# Patient Record
Sex: Female | Born: 2000 | Race: Black or African American | Hispanic: No | Marital: Single | Smoking: Current some day smoker
Health system: Southern US, Community
[De-identification: ages and names within clinical notes are randomized; demographics above are authoritative.]

## PROBLEM LIST (undated history)

## (undated) DIAGNOSIS — D649 Anemia, unspecified: Secondary | ICD-10-CM

## (undated) DIAGNOSIS — Z789 Other specified health status: Secondary | ICD-10-CM

## (undated) HISTORY — PX: NO PAST SURGERIES: SHX2092

---
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
## 2021-02-28 ENCOUNTER — Emergency Department (HOSPITAL_COMMUNITY)
Admission: EM | Admit: 2021-02-28 | Discharge: 2021-02-28 | Discharge status: Against Medical Advice | Payer: Medicaid Other

## 2021-02-28 ENCOUNTER — Encounter (HOSPITAL_COMMUNITY): Payer: Self-pay | Admitting: *Deleted

## 2021-02-28 NOTE — ED Triage Notes (Signed)
Pt has two tattoos, one to r hand and one to R leg, completed at home. Redness and warmth noted to R leg with drainage.   After completing triage, pt chose to leave due to wait

## 2021-06-12 ENCOUNTER — Other Ambulatory Visit: Payer: Self-pay

## 2021-06-12 ENCOUNTER — Emergency Department (HOSPITAL_COMMUNITY)
Admission: EM | Admit: 2021-06-12 | Discharge: 2021-06-12 | Disposition: A | Discharge status: Home / Self Care | Payer: Medicaid Other | Attending: Emergency Medicine | Admitting: Emergency Medicine

## 2021-06-12 ENCOUNTER — Encounter (HOSPITAL_COMMUNITY): Payer: Self-pay | Admitting: Emergency Medicine

## 2021-06-12 DIAGNOSIS — M545 Low back pain, unspecified: Secondary | ICD-10-CM | POA: Insufficient documentation

## 2021-06-12 DIAGNOSIS — N898 Other specified noninflammatory disorders of vagina: Secondary | ICD-10-CM | POA: Diagnosis present

## 2021-06-12 DIAGNOSIS — F17210 Nicotine dependence, cigarettes, uncomplicated: Secondary | ICD-10-CM | POA: Diagnosis not present

## 2021-06-12 LAB — URINALYSIS, ROUTINE W REFLEX MICROSCOPIC
Bilirubin Urine: NEGATIVE
Glucose, UA: NEGATIVE mg/dL
Hgb urine dipstick: NEGATIVE
Ketones, ur: NEGATIVE mg/dL
Leukocytes,Ua: NEGATIVE
Nitrite: NEGATIVE
Protein, ur: NEGATIVE mg/dL
Specific Gravity, Urine: 1.021 (ref 1.005–1.030)
pH: 6 (ref 5.0–8.0)

## 2021-06-12 MED ORDER — FLUCONAZOLE 150 MG PO TABS
150.0000 mg | ORAL_TABLET | Freq: Once | ORAL | Status: AC
  Administered 2021-06-12: 150 mg via ORAL
  Filled 2021-06-12: qty 1

## 2021-06-12 NOTE — Discharge Instructions (Addendum)
Please continue to monitor symptoms closely.  If you develop any new or worsening symptoms please come back to the emergency department.  It was a pleasure to meet you.

## 2021-06-12 NOTE — ED Provider Notes (Signed)
Emergency Medicine Provider Triage Evaluation Note  Deanna Reyes , a 20 y.o. female  was evaluated in triage.  Pt complains of being seen by health department two days ago and diagnosed with UTI (patient unsure about official diagnosis). She was placed on bactrim DS BID. Patient reports waking up this morning with some nausea, drowsiness. No rash, no throat swelling, no heart racing. Patient endorses some burning when she washes her vagina that is new. No dysuria, hematuria.  Review of Systems  Positive: As above Negative: As above  Physical Exam  BP 111/67 (BP Location: Right Arm)   Pulse 84   Temp 98 F (36.7 C) (Oral)   Resp 18   SpO2 100%  Gen:   Awake, no distress   Resp:  Normal effort  MSK:   Moves extremities without difficulty  Other:  No TTP suprapubic, no CVA tenderness  Medical Decision Making  Medically screening exam initiated at 6:07 PM.  Appropriate orders placed.  Deanna Reyes was informed that the remainder of the evaluation will be completed by another provider, this initial triage assessment does not replace that evaluation, and the importance of remaining in the ED until their evaluation is complete.  UTI(?), possible bactrim reaction, no signs of anaphylaxis   Olene Floss, PA-C 06/12/21 1809    Rolan Bucco, MD 06/12/21 2012

## 2021-06-12 NOTE — ED Provider Notes (Signed)
[facility] EMERGENCY DEPARTMENT Provider Note   CSN: [id number] Arrival date & time: 06/12/21  1748     History No chief complaint on file.   Deanna Reyes is a 20 y.o. female.  HPI Patient is a 20 year old female with a medical history as noted below.  She states that a few days ago she began developing some lower abdominal cramping as well as low back pain.  No dysuria or hematuria.  She was concerned that she could have a UTI so she went to the health department.  She states that she had a pelvic exam performed and showed me her lab results.  She was negative for gonorrhea, chlamydia, as well as trichomonas.  Wet prep was negative.  UA concerning for infection and she was discharged on Bactrim.  She has taken 4 doses of Bactrim.  She states that she has been experiencing nausea, vomiting, as well as vaginal irritation since starting Bactrim.  She states that the irritation only occurs when she is washing her self.  Denies any continued abdominal pain or back pain.  No other urinary complaints.    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/02/2019   Hx of chlamydia infection 04/02/2019    Past Surgical History:  Procedure Laterality Date   NO PAST SURGERIES       OB History     Gravida  1   Para  1   Term  1   Preterm      AB      Living  1      SAB      IAB      Ectopic      Multiple  0   Live Births  1           Family History  Problem Relation Age of Onset   Diabetes type I Mother    Lung cancer Father        smoker     Social History   Tobacco Use   Smoking status: Some Days    Types: Cigarettes   Smokeless tobacco: Current  Vaping Use   Vaping Use: Every day  Substance Use Topics   Alcohol use: No   Drug use: No    Home Medications Prior to Admission  medications   Medication Sig Start Date End Date Taking? Authorizing Provider  ibuprofen (ADVIL) 800 MG tablet Take 1 tablet (800 mg total) by mouth every 8 (eight) hours as needed. 07/23/20   Constant, Peggy, MD  metroNIDAZOLE (FLAGYL) 500 MG tablet Take two tablets by mouth twice a day, for one day.  Or you can take all four tablets at once if you can tolerate it. 04/16/20   Adam Phenix, MD  metroNIDAZOLE (FLAGYL) 500 MG tablet Take two tablets by mouth twice a day, for one day.  Or you can take all four tablets at once if you can tolerate it. 08/05/20   Adam Phenix, MD  Prenatal Vit-Fe Phos-FA-Omega (VITAFOL GUMMIES) 3.33-0.333-34.8 MG CHEW Chew 3 tablets by mouth daily. Patient not taking: Reported on 04/09/2020 12/10/18   Hurshel Party, CNM    Allergies    Patient has no known allergies.  Review of Systems   Review of Systems  All other systems reviewed and are negative. Ten systems  reviewed and are negative for acute change, except as noted in the HPI.   Physical Exam Updated Vital Signs BP 111/67 (BP Location: Right Arm)   Pulse 84   Temp 98 F (36.7 C) (Oral)   Resp 18   SpO2 100%   Physical Exam Vitals and nursing note reviewed.  Constitutional:      General: She is not in acute distress.    Appearance: Normal appearance. She is not ill-appearing, toxic-appearing or diaphoretic.  HENT:     Head: Normocephalic and atraumatic.     Right Ear: External ear normal.     Left Ear: External ear normal.     Nose: Nose normal.     Mouth/Throat:     Mouth: Mucous membranes are moist.     Pharynx: Oropharynx is clear. No oropharyngeal exudate or posterior oropharyngeal erythema.  Eyes:     Extraocular Movements: Extraocular movements intact.  Cardiovascular:     Rate and Rhythm: Normal rate and regular rhythm.     Pulses: Normal pulses.     Heart sounds: Normal heart sounds. No murmur heard.   No friction rub. No gallop.  Pulmonary:     Effort: Pulmonary effort  is normal. No respiratory distress.     Breath sounds: Normal breath sounds. No stridor. No wheezing, rhonchi or rales.  Abdominal:     General: Abdomen is flat.     Palpations: Abdomen is soft.     Tenderness: There is no abdominal tenderness.     Comments: Abdomen is flat, soft, and nontender.  No CVA tenderness.  Musculoskeletal:        General: Normal range of motion.     Cervical back: Normal range of motion and neck supple. No tenderness.  Skin:    General: Skin is warm and dry.  Neurological:     General: No focal deficit present.     Mental Status: She is alert and oriented to person, place, and time.  Psychiatric:        Mood and Affect: Mood normal.        Behavior: Behavior normal.   ED Results / Procedures / Treatments   Labs (all labs ordered are listed, but only abnormal results are displayed) Labs Reviewed  URINALYSIS, ROUTINE W REFLEX MICROSCOPIC - Abnormal; Notable for the following components:      Result Value   APPearance HAZY (*)    All other components within normal limits    EKG None  Radiology No results found.  Procedures Procedures   Medications Ordered in ED Medications  fluconazole (DIFLUCAN) tablet 150 mg (has no administration in time range)    ED Course  I have reviewed the triage vital signs and the nursing notes.  Pertinent labs & imaging results that were available during my care of the patient were reviewed by me and considered in my medical decision making (see chart for details).    MDM Rules/Calculators/A&P                          Pt is a 20 y.o. female who presents to the emergency department due to nausea, vomiting, as well as vaginal irritation that began after starting Bactrim yesterday.  Labs: UA is negative.  I, Placido SouLogan Nasia Cannan, PA-C, personally reviewed and evaluated these images and lab results as part of my medical decision-making.  Patient brought her lab work from the health department 2 days ago.  She had a  pelvic  exam performed and was negative for trichomoniasis, gonorrhea, as well as chlamydia.  Wet prep was negative.  UA did appear infectious at that time so she was started on Bactrim but UA today is clear.  Patient does note some vaginal irritation that she describes as a burning sensation when washing her self.  She states that this along with nausea/vomiting developed after starting Bactrim yesterday.  Possible developing yeast infection.  We will treat with a dose of Diflucan in the ED.  Feel the patient is stable for discharge at this time and she is agreeable.  We discussed return precautions.  Her questions were answered and she was amicable at the time of discharge.  Note: Portions of this report may have been transcribed using voice recognition software. Every effort was made to ensure accuracy; however, inadvertent computerized transcription errors may be present.   Final Clinical Impression(s) / ED Diagnoses Final diagnoses:  Vaginal irritation    Rx / DC Orders ED Discharge Orders     None        Rayna Sexton, PA-C 06/12/21 2037    Gareth Morgan, MD 06/14/21 2153

## 2021-06-12 NOTE — ED Triage Notes (Signed)
Pt states she went to the health dept on Thursday and is taking an antibiotic for a UTI.  She is unsure which antibiotic.  Reports feeling lightheaded, nausea, vomiting, and diarrhea that started today.    Denies pain.

## 2022-07-11 ENCOUNTER — Emergency Department (HOSPITAL_COMMUNITY): Payer: Medicaid Other

## 2022-07-11 ENCOUNTER — Emergency Department (HOSPITAL_COMMUNITY)
Admission: EM | Admit: 2022-07-11 | Discharge: 2022-07-12 | Discharge status: Against Medical Advice | Payer: Medicaid Other | Attending: Emergency Medicine | Admitting: Emergency Medicine

## 2022-07-11 ENCOUNTER — Encounter (HOSPITAL_COMMUNITY): Payer: Self-pay

## 2022-07-11 DIAGNOSIS — Z1152 Encounter for screening for COVID-19: Secondary | ICD-10-CM | POA: Diagnosis not present

## 2022-07-11 DIAGNOSIS — R059 Cough, unspecified: Secondary | ICD-10-CM | POA: Diagnosis present

## 2022-07-11 DIAGNOSIS — R519 Headache, unspecified: Secondary | ICD-10-CM | POA: Diagnosis not present

## 2022-07-11 DIAGNOSIS — R0602 Shortness of breath: Secondary | ICD-10-CM | POA: Insufficient documentation

## 2022-07-11 DIAGNOSIS — Z5321 Procedure and treatment not carried out due to patient leaving prior to being seen by health care provider: Secondary | ICD-10-CM | POA: Insufficient documentation

## 2022-07-11 DIAGNOSIS — R0981 Nasal congestion: Secondary | ICD-10-CM | POA: Insufficient documentation

## 2022-07-11 LAB — RESP PANEL BY RT-PCR (FLU A&B, COVID) ARPGX2
Influenza A by PCR: NEGATIVE
Influenza B by PCR: NEGATIVE
SARS Coronavirus 2 by RT PCR: NEGATIVE

## 2022-07-11 NOTE — ED Triage Notes (Signed)
Pt to ED c/o cold symptoms x 2 weeks, cough, runny nose, congestion. reports known sick contacts, daughter being treated for pneumonia.

## 2022-07-11 NOTE — ED Provider Triage Note (Signed)
Emergency Medicine Provider Triage Evaluation Note  Deanna Reyes , a 21 y.o. female  was evaluated in triage.  Pt complains of upper respiratory infection symptoms.  States that she has had cough and congestion for about 2 weeks.  She has had intermittent headache and generally just feels ill.  She denies sore throat, chest pain.  Intermittently short of breath but denies any fevers.  She states that her daughter is also sick..  Review of Systems  Positive: See above Negative:   Physical Exam  BP 109/70 (BP Location: Left Arm)   Pulse 63   Temp 98.2 F (36.8 C) (Oral)   Resp 16   Ht 5\' 4"  (1.626 m)   Wt 53.1 kg   SpO2 100%   BMI 20.08 kg/m  Gen:   Awake, no distress   Resp:  Normal effort, lungs are clear bilaterally, oxygenating at 100% on room air MSK:   Moves extremities without difficulty  Other:    Medical Decision Making  Medically screening exam initiated at 8:59 PM.  Appropriate orders placed.  Deanna Reyes was informed that the remainder of the evaluation will be completed by another provider, this initial triage assessment does not replace that evaluation, and the importance of remaining in the ED until their evaluation is complete.     Lorretta Harp, PA-C 07/11/22 2102

## 2022-07-12 NOTE — ED Notes (Signed)
Pt called x5 for vitals recheck, no response. Moving pt OTF.

## 2022-08-30 ENCOUNTER — Telehealth: Payer: Medicaid Other | Admitting: Physician Assistant

## 2022-08-30 DIAGNOSIS — B9689 Other specified bacterial agents as the cause of diseases classified elsewhere: Secondary | ICD-10-CM

## 2022-08-30 DIAGNOSIS — N760 Acute vaginitis: Secondary | ICD-10-CM | POA: Diagnosis not present

## 2022-08-30 MED ORDER — METRONIDAZOLE 500 MG PO TABS: 500.0000 mg | ORAL_TABLET | Freq: Two times a day (BID) | ORAL | 0 refills | Status: DC

## 2022-08-30 NOTE — Progress Notes (Signed)

## 2022-08-30 NOTE — Progress Notes (Signed)
I have spent 5 minutes in review of e-visit questionnaire, review and updating patient chart, medical decision making and response to patient.   Leray Garverick Cody Rejoice Heatwole, PA-C    

## 2022-09-13 ENCOUNTER — Ambulatory Visit: Payer: Medicaid Other

## 2022-09-17 ENCOUNTER — Telehealth: Payer: Medicaid Other | Admitting: Nurse Practitioner

## 2022-09-17 DIAGNOSIS — J029 Acute pharyngitis, unspecified: Secondary | ICD-10-CM

## 2022-09-17 MED ORDER — AMOXICILLIN 500 MG PO CAPS
500.0000 mg | ORAL_CAPSULE | Freq: Two times a day (BID) | ORAL | 0 refills | Status: AC
Start: 2022-09-17 — End: 2022-09-27

## 2022-09-17 NOTE — Progress Notes (Signed)

## 2022-09-18 ENCOUNTER — Telehealth: Payer: Medicaid Other | Admitting: Nurse Practitioner

## 2022-09-18 NOTE — Progress Notes (Signed)
Antibiotics sent yesterday

## 2022-10-02 ENCOUNTER — Telehealth: Payer: Medicaid Other | Admitting: Physician Assistant

## 2022-10-02 DIAGNOSIS — N760 Acute vaginitis: Secondary | ICD-10-CM

## 2022-10-02 DIAGNOSIS — B9689 Other specified bacterial agents as the cause of diseases classified elsewhere: Secondary | ICD-10-CM | POA: Diagnosis not present

## 2022-10-02 MED ORDER — METRONIDAZOLE 500 MG PO TABS: 500.0000 mg | ORAL_TABLET | Freq: Two times a day (BID) | ORAL | 0 refills | Status: AC

## 2022-10-02 NOTE — Progress Notes (Signed)
E-Visit for Vaginal Symptoms  We are sorry that you are not feeling well. Here is how we plan to help! Based on what you shared with me it looks like you: May have a vaginosis due to bacteria  Vaginosis is an inflammation of the vagina that can result in discharge, itching and pain. The cause is usually a change in the normal balance of vaginal bacteria or an infection. Vaginosis can also result from reduced estrogen levels after menopause.  The most common causes of vaginosis are:   Bacterial vaginosis which results from an overgrowth of one on several organisms that are normally present in your vagina.   Yeast infections which are caused by a naturally occurring fungus called candida.   Vaginal atrophy (atrophic vaginosis) which results from the thinning of the vagina from reduced estrogen levels after menopause.   Trichomoniasis which is caused by a parasite and is commonly transmitted by sexual intercourse.  Factors that increase your risk of developing vaginosis include: Medications, such as antibiotics and steroids Uncontrolled diabetes Use of hygiene products such as bubble bath, vaginal spray or vaginal deodorant Douching Wearing damp or tight-fitting clothing Using an intrauterine device (IUD) for birth control Hormonal changes, such as those associated with pregnancy, birth control pills or menopause Sexual activity Having a sexually transmitted infection  Your treatment plan is Metronidazole or Flagyl 500mg twice a day for 7 days.  I have electronically sent this prescription into the pharmacy that you have chosen.  Be sure to take all of the medication as directed. Stop taking any medication if you develop a rash, tongue swelling or shortness of breath. Mothers who are breast feeding should consider pumping and discarding their breast milk while on these antibiotics. However, there is no consensus that infant exposure at these doses would be harmful.  Remember that  medication creams can weaken latex condoms. .   HOME CARE:  Good hygiene may prevent some types of vaginosis from recurring and may relieve some symptoms:  Avoid baths, hot tubs and whirlpool spas. Rinse soap from your outer genital area after a shower, and dry the area well to prevent irritation. Don't use scented or harsh soaps, such as those with deodorant or antibacterial action. Avoid irritants. These include scented tampons and pads. Wipe from front to back after using the toilet. Doing so avoids spreading fecal bacteria to your vagina.  Other things that may help prevent vaginosis include:  Don't douche. Your vagina doesn't require cleansing other than normal bathing. Repetitive douching disrupts the normal organisms that reside in the vagina and can actually increase your risk of vaginal infection. Douching won't clear up a vaginal infection. Use a latex condom. Both female and female latex condoms may help you avoid infections spread by sexual contact. Wear cotton underwear. Also wear pantyhose with a cotton crotch. If you feel comfortable without it, skip wearing underwear to bed. Yeast thrives in moist environments Your symptoms should improve in the next day or two.  GET HELP RIGHT AWAY IF:  You have pain in your lower abdomen ( pelvic area or over your ovaries) You develop nausea or vomiting You develop a fever Your discharge changes or worsens You have persistent pain with intercourse You develop shortness of breath, a rapid pulse, or you faint.  These symptoms could be signs of problems or infections that need to be evaluated by a medical provider now.  MAKE SURE YOU   Understand these instructions. Will watch your condition. Will get help right   away if you are not doing well or get worse.  Thank you for choosing an e-visit.  Your e-visit answers were reviewed by a board certified advanced clinical practitioner to complete your personal care plan. Depending upon the  condition, your plan could have included both over the counter or prescription medications.  Please review your pharmacy choice. Make sure the pharmacy is open so you can pick up prescription now. If there is a problem, you may contact your provider through MyChart messaging and have the prescription routed to another pharmacy.  Your safety is important to us. If you have drug allergies check your prescription carefully.   For the next 24 hours you can use MyChart to ask questions about today's visit, request a non-urgent call back, or ask for a work or school excuse. You will get an email in the next two days asking about your experience. I hope that your e-visit has been valuable and will speed your recovery.   I have spent 5 minutes in review of e-visit questionnaire, review and updating patient chart, medical decision making and response to patient.   Cornelio Parkerson Z Ward, PA-C    

## 2024-01-01 ENCOUNTER — Encounter (HOSPITAL_COMMUNITY): Payer: Self-pay

## 2024-01-01 ENCOUNTER — Emergency Department (HOSPITAL_COMMUNITY): Payer: Worker's Compensation

## 2024-01-01 ENCOUNTER — Emergency Department (HOSPITAL_COMMUNITY)
Admission: EM | Admit: 2024-01-01 | Discharge: 2024-01-01 | Disposition: A | Discharge status: Home / Self Care | Payer: Worker's Compensation | Attending: Emergency Medicine | Admitting: Emergency Medicine

## 2024-01-01 ENCOUNTER — Other Ambulatory Visit: Payer: Self-pay

## 2024-01-01 DIAGNOSIS — Y990 Civilian activity done for income or pay: Secondary | ICD-10-CM | POA: Insufficient documentation

## 2024-01-01 DIAGNOSIS — Z23 Encounter for immunization: Secondary | ICD-10-CM | POA: Diagnosis not present

## 2024-01-01 DIAGNOSIS — S62633B Displaced fracture of distal phalanx of left middle finger, initial encounter for open fracture: Secondary | ICD-10-CM | POA: Insufficient documentation

## 2024-01-01 DIAGNOSIS — W208XXA Other cause of strike by thrown, projected or falling object, initial encounter: Secondary | ICD-10-CM | POA: Insufficient documentation

## 2024-01-01 DIAGNOSIS — S6992XA Unspecified injury of left wrist, hand and finger(s), initial encounter: Secondary | ICD-10-CM | POA: Diagnosis present

## 2024-01-01 MED ORDER — OXYCODONE-ACETAMINOPHEN 5-325 MG PO TABS
1.0000 | ORAL_TABLET | Freq: Once | ORAL | Status: AC
  Administered 2024-01-01: 1 via ORAL
  Filled 2024-01-01: qty 1

## 2024-01-01 MED ORDER — MORPHINE SULFATE (PF) 4 MG/ML IV SOLN: 4.0000 mg | Freq: Once | INTRAVENOUS | Status: DC

## 2024-01-01 MED ORDER — CEPHALEXIN 500 MG PO CAPS: 500.0000 mg | ORAL_CAPSULE | Freq: Four times a day (QID) | ORAL | 0 refills | Status: DC

## 2024-01-01 MED ORDER — LIDOCAINE HCL (PF) 1 % IJ SOLN
5.0000 mL | Freq: Once | INTRAMUSCULAR | Status: AC
  Administered 2024-01-01: 5 mL
  Filled 2024-01-01: qty 5

## 2024-01-01 MED ORDER — KETOROLAC TROMETHAMINE 30 MG/ML IJ SOLN: 30.0000 mg | Freq: Once | INTRAMUSCULAR | Status: DC

## 2024-01-01 MED ORDER — TETANUS-DIPHTH-ACELL PERTUSSIS 5-2.5-18.5 LF-MCG/0.5 IM SUSY
0.5000 mL | PREFILLED_SYRINGE | Freq: Once | INTRAMUSCULAR | Status: AC
  Administered 2024-01-01: 0.5 mL via INTRAMUSCULAR
  Filled 2024-01-01: qty 0.5

## 2024-01-01 MED ORDER — CEFAZOLIN SODIUM-DEXTROSE 1-4 GM/50ML-% IV SOLN
1.0000 g | Freq: Once | INTRAVENOUS | Status: AC
  Administered 2024-01-01: 1 g via INTRAVENOUS
  Filled 2024-01-01: qty 50

## 2024-01-01 NOTE — ED Provider Notes (Signed)
 [place] EMERGENCY DEPARTMENT AT [facility] Provider Note   CSN: [id number] Arrival date & time: 01/01/24  0132     History  Chief Complaint  Patient presents with   Finger Injury    Deanna Reyes is a 23 y.o. female.  HPI     This is a 23 year old female who presents with injury to the left 3rd and 4th digits.  Patient reports that she was at work when the ice machine fell over trapping her left 3rd and 4th digits.  As the ice machine fell she pulled her fingers out.  She sustained crush injuries to both digits.  She noticed the nail on the third digit was dislodged and she has a laceration on the pad of that digit.  She is left-handed.  Denies other injury.  Unknown last tetanus shot.  Home Medications Prior to Admission medications   Medication Sig Start Date End Date Taking? Authorizing Provider  cephALEXin (KEFLEX) 500 MG capsule Take 1 capsule (500 mg total) by mouth 4 (four) times daily. 01/01/24  Yes Mattia Osterman, Vonzella Guernsey, MD  ibuprofen  (ADVIL ) 800 MG tablet Take 1 tablet (800 mg total) by mouth every 8 (eight) hours as needed. 07/23/20   Constant, Peggy, MD  Prenatal Vit-Fe Phos-FA-Omega (VITAFOL  GUMMIES) 3.33-0.333-34.8 MG CHEW Chew 3 tablets by mouth daily. Patient not taking: Reported on 04/09/2020 12/10/18   Asher Lawn, CNM      Allergies    Patient has no known allergies.    Review of Systems   Review of Systems  Musculoskeletal:        Finger swelling and pain  Skin:  Positive for wound.    Physical Exam Updated Vital Signs BP 110/73   Pulse 89   Temp 98 F (36.7 C)   Resp 18   Ht 1.626 m (5\' 4" )   Wt 52.2 kg   SpO2 100%   BMI 19.74 kg/m  Physical Exam Vitals and nursing note reviewed.  Constitutional:      Appearance: She is well-developed. She is not ill-appearing.  HENT:     Head: Normocephalic and atraumatic.  Eyes:     Pupils: Pupils are equal, round, and reactive to light.  Cardiovascular:     Rate and Rhythm:  Normal rate and regular rhythm.  Pulmonary:     Effort: Pulmonary effort is normal. No respiratory distress.  Abdominal:     Palpations: Abdomen is soft.  Musculoskeletal:     Cervical back: Neck supple.     Comments: Focused examination of the left hand with tenderness to palpation of the distal third digit, there is avulsion and lifting of the nail bed proximally at the cuticle edge, there is an old laceration over the pad of that digit with some oozing Fourth digit with bruising noted, no lacerations, flexion extension of all 5 digits intact  Skin:    General: Skin is warm and dry.  Neurological:     Mental Status: She is alert and oriented to person, place, and time.  Psychiatric:        Mood and Affect: Mood normal.        ED Results / Procedures / Treatments   Labs (all labs ordered are listed, but only abnormal results are displayed) Labs Reviewed - No data to display  EKG None  Radiology DG Hand Complete Left Result Date: 01/01/2024 CLINICAL DATA:  Status post trauma. EXAM: LEFT HAND - COMPLETE 3+ VIEW COMPARISON:  None Available. FINDINGS: An acute  comminuted fracture deformity is seen involving the tuft of the distal phalanx of the third left finger. There is no evidence of dislocation. There is no evidence of arthropathy or other focal bone abnormality. Soft tissues are unremarkable. IMPRESSION: Acute fracture of the distal phalanx of the third left finger. Electronically Signed   By: Virgle Grime M.D.   On: 01/01/2024 02:39    Procedures .Laceration Repair  Date/Time: 01/01/2024 3:41 AM  Performed by: Rory Collard, MD Authorized by: Rory Collard, MD   Consent:    Consent obtained:  Verbal   Consent given by:  Patient   Risks, benefits, and alternatives were discussed: yes     Risks discussed:  Infection and pain   Alternatives discussed:  No treatment Anesthesia:    Anesthesia method:  Local infiltration   Local anesthetic:  Lidocaine  2%  w/o epi Laceration details:    Location:  Finger   Finger location:  L long finger   Length (cm):  1   Depth (mm):  3 Pre-procedure details:    Preparation:  Patient was prepped and draped in usual sterile fashion Exploration:    Limited defect created (wound extended): no     Hemostasis achieved with:  Tourniquet   Imaging obtained: x-ray     Imaging outcome: foreign body not noted     Wound extent: underlying fracture     Wound extent: no nerve damage and no tendon damage     Contaminated: no   Treatment:    Area cleansed with:  Povidone-iodine   Amount of cleaning:  Standard   Irrigation solution:  Sterile saline   Irrigation volume:  1 L   Visualized foreign bodies/material removed: no     Debridement:  None   Scar revision: no   Skin repair:    Repair method:  Sutures   Suture size:  4-0   Suture material:  Nylon   Suture technique:  Simple interrupted   Number of sutures:  2 Approximation:    Approximation:  Close Repair type:    Repair type:  Simple Post-procedure details:    Dressing:  Antibiotic ointment, splint for protection and non-adherent dressing   Procedure completion:  Tolerated     Medications Ordered in ED Medications  lidocaine  (PF) (XYLOCAINE ) 1 % injection 5 mL (has no administration in time range)  Tdap (BOOSTRIX) injection 0.5 mL (0.5 mLs Intramuscular Given 01/01/24 0229)  oxyCODONE -acetaminophen  (PERCOCET/ROXICET) 5-325 MG per tablet 1 tablet (1 tablet Oral Given 01/01/24 0231)  ceFAZolin (ANCEF) IVPB 1 g/50 mL premix (0 g Intravenous Stopped 01/01/24 4098)    ED Course/ Medical Decision Making/ A&P                                 Medical Decision Making Amount and/or Complexity of Data Reviewed Radiology: ordered.  Risk Prescription drug management.   This patient presents to the ED for concern of finger injury, this involves an extensive number of treatment options, and is a complaint that carries with it a high risk of  complications and morbidity.  I considered the following differential and admission for this acute, potentially life threatening condition.  The differential diagnosis includes fracture, nailbed injury, laceration, crush injury  MDM:    This is a 23 year old female who presents with an injury to the left 3rd and 4th digits.  She is nontoxic and vital signs are reassuring.  She has a  laceration and a nail injury to the third digit and a contusion to the fourth digit.  X-rays do show a tuft fracture on the third digit.  Fracture technically open.  Patient given Ancef.  Discussed with her that proximal end of the nail appears to have lifted off the nail bed.  However, would recommend to leave as is so it can form a biologic Band-Aid.  It may very well fall off.  Wound was copiously irrigated and stitches were placed on laceration.  Will send home with antibiotics.  Patient was placed in a splint.  She will need close follow-up with hand.  Message sent to Dr. Guyann Leitz.  She was given return precaution.  (Labs, imaging, consults)  Labs: I Ordered, and personally interpreted labs.  The pertinent results include: None  Imaging Studies ordered: I ordered imaging studies including x-ray I independently visualized and interpreted imaging. I agree with the radiologist interpretation  Additional history obtained from chart review.  External records from outside source obtained and reviewed including prior eval's  Cardiac Monitoring: The patient was not maintained on a cardiac monitor.  If on the cardiac monitor, I personally viewed and interpreted the cardiac monitored which showed an underlying rhythm of: N/A  Reevaluation: After the interventions noted above, I reevaluated the patient and found that they have :improved  Social Determinants of Health:  lives independently  Disposition: Discharge  Co morbidities that complicate the patient evaluation  Past Medical History:  Diagnosis Date   Anemia     Medical history non-contributory      Medicines Meds ordered this encounter  Medications   Tdap (BOOSTRIX) injection 0.5 mL   oxyCODONE -acetaminophen  (PERCOCET/ROXICET) 5-325 MG per tablet 1 tablet    Refill:  0   ceFAZolin (ANCEF) IVPB 1 g/50 mL premix    Antibiotic Indication::   Wound Infection   lidocaine  (PF) (XYLOCAINE ) 1 % injection 5 mL   cephALEXin (KEFLEX) 500 MG capsule    Sig: Take 1 capsule (500 mg total) by mouth 4 (four) times daily.    Dispense:  20 capsule    Refill:  0    I have reviewed the patients home medicines and have made adjustments as needed  Problem List / ED Course: Problem List Items Addressed This Visit   None Visit Diagnoses       Open displaced fracture of distal phalanx of left middle finger, initial encounter    -  Primary                   Final Clinical Impression(s) / ED Diagnoses Final diagnoses:  Open displaced fracture of distal phalanx of left middle finger, initial encounter    Rx / DC Orders ED Discharge Orders          Ordered    cephALEXin (KEFLEX) 500 MG capsule  4 times daily        01/01/24 0340              Vannary Greening, Vonzella Guernsey, MD 01/01/24 [phone]

## 2024-01-01 NOTE — Discharge Instructions (Signed)
 You were seen today for injury to your finger.  You have an open fracture of the finger.  Biggest risk is for infection.  Take antibiotics as prescribed.  Call Dr. Tommy Frames office on Tuesday to get a follow-up appointment.  You will need your stitches removed in approximately 10 days.  Monitor for signs and symptoms of infection.

## 2024-01-01 NOTE — ED Triage Notes (Signed)
 Pt complaining of cutting her left middle finger, distal knuckle on the palmar side. on a screw on an icee machine at work. Laceration is 0.5cm in a hook shape. Is still bleeding.The index finger is bruised on the distal phalanax

## 2024-01-10 ENCOUNTER — Other Ambulatory Visit: Payer: Self-pay | Admitting: Orthopedic Surgery

## 2024-01-10 ENCOUNTER — Encounter (HOSPITAL_BASED_OUTPATIENT_CLINIC_OR_DEPARTMENT_OTHER): Payer: Self-pay | Admitting: Orthopedic Surgery

## 2024-01-10 ENCOUNTER — Other Ambulatory Visit: Payer: Self-pay

## 2024-01-10 NOTE — Progress Notes (Signed)
   01/10/24 1237  Pre-op Phone Call  Surgery Date Verified 01/17/24  Arrival Time Verified 1045  Surgery Location Verified [facility] [place]  Medical History Reviewed Yes  Is the patient taking a GLP-1 receptor agonist? No  Does the patient have diabetes? No diagnosis of diabetes  Do you have a history of heart problems? No  Does patient have other implanted devices? No  Patient Teaching Pre / Post Procedure  Patient educated about smoking cessation 24 hours prior to surgery. N/A Non-Smoker  Patient verbalizes understanding of bowel prep? N/A  Med Rec Completed Yes  Take the Following Meds the Morning of Surgery no meds  Recent  Lab Work, EKG, CXR? No  NPO (Including gum & candy) After midnight  Stop Solids, Milk, Candy, and Gum STARTING AT MIDNIGHT  Responsible adult to drive and be with you for 24 hours? Yes  Name & Phone Number for Ride/Caregiver boyfriend, Dyani Cates  No Jewelry, money, nail polish or make-up.  No lotions, powders, perfumes. No shaving  48 hrs. prior to surgery. Yes  Contacts, Dentures & Glasses Will Have to be Removed Before OR. Yes  Please bring your ID and Insurance Card the morning of your surgery. (Surgery Centers Only) Yes  Bring any papers or x-rays with you that your surgeon gave you. Yes  Instructed to contact the location of procedure/ provider if they or anyone in their household develops symptoms or tests positive for COVID-19, has close contact with someone who tests positive for COVID, or has known exposure to any contagious illness. Yes  Call this number the morning of surgery  with any problems that may cancel your surgery. [phone]  Covid-19 Assessment  Have you had a positive COVID-19 test within the previous 90 days? No  COVID Testing Guidance Proceed with the additional questions.  Patient's surgery required a COVID-19 test (cardiothoracic, complex ENT, and bronchoscopies/ EBUS) No  Have you been unmasked and in close contact with anyone with COVID-19  or COVID-19 symptoms within the past 10 days? No  Do you or anyone in your household currently have any COVID-19 symptoms? No

## 2024-01-11 ENCOUNTER — Encounter (HOSPITAL_BASED_OUTPATIENT_CLINIC_OR_DEPARTMENT_OTHER)
Admission: RE | Disposition: A | Discharge status: Home / Self Care | Payer: Self-pay | Source: Home / Self Care | Attending: Orthopedic Surgery

## 2024-01-11 ENCOUNTER — Ambulatory Visit (HOSPITAL_BASED_OUTPATIENT_CLINIC_OR_DEPARTMENT_OTHER): Payer: Worker's Compensation | Admitting: Anesthesiology

## 2024-01-11 ENCOUNTER — Encounter (HOSPITAL_BASED_OUTPATIENT_CLINIC_OR_DEPARTMENT_OTHER): Payer: Self-pay | Admitting: Orthopedic Surgery

## 2024-01-11 ENCOUNTER — Ambulatory Visit (HOSPITAL_BASED_OUTPATIENT_CLINIC_OR_DEPARTMENT_OTHER): Payer: Worker's Compensation

## 2024-01-11 ENCOUNTER — Other Ambulatory Visit: Payer: Self-pay

## 2024-01-11 ENCOUNTER — Ambulatory Visit (HOSPITAL_BASED_OUTPATIENT_CLINIC_OR_DEPARTMENT_OTHER)
Admission: RE | Admit: 2024-01-11 | Discharge: 2024-01-11 | Disposition: A | Discharge status: Home / Self Care | Payer: Worker's Compensation | Attending: Orthopedic Surgery | Admitting: Orthopedic Surgery

## 2024-01-11 DIAGNOSIS — S62633B Displaced fracture of distal phalanx of left middle finger, initial encounter for open fracture: Secondary | ICD-10-CM

## 2024-01-11 DIAGNOSIS — F17210 Nicotine dependence, cigarettes, uncomplicated: Secondary | ICD-10-CM | POA: Insufficient documentation

## 2024-01-11 DIAGNOSIS — W230XXA Caught, crushed, jammed, or pinched between moving objects, initial encounter: Secondary | ICD-10-CM | POA: Diagnosis not present

## 2024-01-11 DIAGNOSIS — S67193A Crushing injury of left middle finger, initial encounter: Secondary | ICD-10-CM | POA: Insufficient documentation

## 2024-01-11 DIAGNOSIS — S61412A Laceration without foreign body of left hand, initial encounter: Secondary | ICD-10-CM | POA: Diagnosis not present

## 2024-01-11 DIAGNOSIS — Z419 Encounter for procedure for purposes other than remedying health state, unspecified: Secondary | ICD-10-CM

## 2024-01-11 HISTORY — PX: NAILBED REPAIR: SHX5028

## 2024-01-11 HISTORY — PX: OPEN REDUCTION INTERNAL FIXATION (ORIF) DISTAL PHALANX: SHX6236

## 2024-01-11 HISTORY — PX: IRRIGATION AND DEBRIDEMENT POSTERIOR HIP: SHX7265

## 2024-01-11 LAB — POCT PREGNANCY, URINE: Preg Test, Ur: NEGATIVE

## 2024-01-11 SURGERY — IRRIGATION AND DEBRIDEMENT, OPEN FRACTURE
Anesthesia: General | Site: Middle Finger | Laterality: Left

## 2024-01-11 MED ORDER — FENTANYL CITRATE (PF) 100 MCG/2ML IJ SOLN: 25.0000 ug | INTRAMUSCULAR | Status: DC | PRN

## 2024-01-11 MED ORDER — PROPOFOL 10 MG/ML IV BOLUS
INTRAVENOUS | Status: DC | PRN
  Administered 2024-01-11: 200 mg via INTRAVENOUS

## 2024-01-11 MED ORDER — AMISULPRIDE (ANTIEMETIC) 5 MG/2ML IV SOLN: 10.0000 mg | Freq: Once | INTRAVENOUS | Status: DC | PRN

## 2024-01-11 MED ORDER — HYDROCODONE-ACETAMINOPHEN 5-325 MG PO TABS: 1.0000 | ORAL_TABLET | Freq: Four times a day (QID) | ORAL | 0 refills | Status: DC | PRN

## 2024-01-11 MED ORDER — PROPOFOL 10 MG/ML IV BOLUS
INTRAVENOUS | Status: AC
Start: 2024-01-11 — End: ?
  Filled 2024-01-11: qty 20

## 2024-01-11 MED ORDER — BUPIVACAINE HCL (PF) 0.25 % IJ SOLN
INTRAMUSCULAR | Status: DC | PRN
  Administered 2024-01-11: 9 mL

## 2024-01-11 MED ORDER — 0.9 % SODIUM CHLORIDE (POUR BTL) OPTIME
TOPICAL | Status: DC | PRN
  Administered 2024-01-11: 100 mL

## 2024-01-11 MED ORDER — EPHEDRINE SULFATE (PRESSORS) 50 MG/ML IJ SOLN
INTRAMUSCULAR | Status: DC | PRN
Start: 2024-01-11 — End: 2024-01-11
  Administered 2024-01-11: 10 mg via INTRAVENOUS
  Administered 2024-01-11: 5 mg via INTRAVENOUS
  Administered 2024-01-11: 10 mg via INTRAVENOUS

## 2024-01-11 MED ORDER — ACETAMINOPHEN 500 MG PO TABS
ORAL_TABLET | ORAL | Status: AC
  Filled 2024-01-11: qty 2

## 2024-01-11 MED ORDER — CEFAZOLIN SODIUM-DEXTROSE 2-3 GM-%(50ML) IV SOLR
INTRAVENOUS | Status: DC | PRN
  Administered 2024-01-11: 2 g via INTRAVENOUS

## 2024-01-11 MED ORDER — OXYCODONE HCL 5 MG/5ML PO SOLN: 5.0000 mg | Freq: Once | ORAL | Status: DC | PRN

## 2024-01-11 MED ORDER — OXYCODONE HCL 5 MG PO TABS: 5.0000 mg | ORAL_TABLET | Freq: Once | ORAL | Status: DC | PRN

## 2024-01-11 MED ORDER — FENTANYL CITRATE (PF) 100 MCG/2ML IJ SOLN
INTRAMUSCULAR | Status: AC
Start: 2024-01-11 — End: ?
  Filled 2024-01-11: qty 2

## 2024-01-11 MED ORDER — LIDOCAINE HCL (CARDIAC) PF 100 MG/5ML IV SOSY
PREFILLED_SYRINGE | INTRAVENOUS | Status: DC | PRN
  Administered 2024-01-11: 40 mg via INTRAVENOUS

## 2024-01-11 MED ORDER — MIDAZOLAM HCL 2 MG/2ML IJ SOLN
INTRAMUSCULAR | Status: AC
  Filled 2024-01-11: qty 2

## 2024-01-11 MED ORDER — ONDANSETRON HCL 4 MG/2ML IJ SOLN
INTRAMUSCULAR | Status: AC
  Filled 2024-01-11: qty 2

## 2024-01-11 MED ORDER — LACTATED RINGERS IV SOLN: INTRAVENOUS | Status: DC

## 2024-01-11 MED ORDER — MIDAZOLAM HCL 5 MG/5ML IJ SOLN
INTRAMUSCULAR | Status: DC | PRN
  Administered 2024-01-11: 2 mg via INTRAVENOUS

## 2024-01-11 MED ORDER — DEXAMETHASONE SODIUM PHOSPHATE 10 MG/ML IJ SOLN
INTRAMUSCULAR | Status: DC | PRN
  Administered 2024-01-11: 10 mg via INTRAVENOUS

## 2024-01-11 MED ORDER — FENTANYL CITRATE (PF) 100 MCG/2ML IJ SOLN
INTRAMUSCULAR | Status: DC | PRN
  Administered 2024-01-11: 50 ug via INTRAVENOUS

## 2024-01-11 MED ORDER — ONDANSETRON HCL 4 MG/2ML IJ SOLN
INTRAMUSCULAR | Status: DC | PRN
  Administered 2024-01-11: 4 mg via INTRAVENOUS

## 2024-01-11 MED ORDER — ACETAMINOPHEN 500 MG PO TABS
1000.0000 mg | ORAL_TABLET | Freq: Once | ORAL | Status: AC
  Administered 2024-01-11: 1000 mg via ORAL

## 2024-01-11 MED ORDER — LIDOCAINE 2% (20 MG/ML) 5 ML SYRINGE
INTRAMUSCULAR | Status: AC
  Filled 2024-01-11: qty 5

## 2024-01-11 MED ORDER — EPHEDRINE 5 MG/ML INJ
INTRAVENOUS | Status: AC
  Filled 2024-01-11: qty 5

## 2024-01-11 MED ORDER — CEFAZOLIN SODIUM-DEXTROSE 2-4 GM/100ML-% IV SOLN
INTRAVENOUS | Status: AC
  Filled 2024-01-11: qty 100

## 2024-01-11 SURGICAL SUPPLY — 62 items
BAG DECANTER FOR FLEXI CONT (MISCELLANEOUS) IMPLANT
BANDAGE GAUZE 1X75IN STRL (MISCELLANEOUS) IMPLANT
BLADE MINI RND TIP GREEN BEAV (BLADE) ×2 IMPLANT
BLADE SURG 15 STRL LF DISP TIS (BLADE) ×4 IMPLANT
BNDG COHESIVE 1X5 TAN STRL LF (GAUZE/BANDAGES/DRESSINGS) IMPLANT
BNDG ELASTIC 2INX 5YD STR LF (GAUZE/BANDAGES/DRESSINGS) IMPLANT
BNDG ELASTIC 3INX 5YD STR LF (GAUZE/BANDAGES/DRESSINGS) IMPLANT
BNDG ESMARK 4X9 LF (GAUZE/BANDAGES/DRESSINGS) ×2 IMPLANT
BNDG GAUZE DERMACEA FLUFF 4 (GAUZE/BANDAGES/DRESSINGS) ×2 IMPLANT
BRUSH SCRUB EZ PLAIN DRY (MISCELLANEOUS) ×2 IMPLANT
CHLORAPREP W/TINT 26 (MISCELLANEOUS) ×2 IMPLANT
CORD BIPOLAR FORCEPS 12FT (ELECTRODE) ×2 IMPLANT
COVER BACK TABLE 60X90IN (DRAPES) ×2 IMPLANT
COVER MAYO STAND STRL (DRAPES) ×2 IMPLANT
CUFF TOURN SGL QUICK 18X4 (TOURNIQUET CUFF) ×2 IMPLANT
DRAPE EXTREMITY T 121X128X90 (DISPOSABLE) ×2 IMPLANT
DRAPE OEC MINIVIEW 54X84 (DRAPES) ×2 IMPLANT
DRAPE SURG 17X23 STRL (DRAPES) ×2 IMPLANT
GAUZE PACKING IODOFORM 1/4X15 (PACKING) IMPLANT
GAUZE SPONGE 4X4 12PLY STRL (GAUZE/BANDAGES/DRESSINGS) ×2 IMPLANT
GAUZE STRETCH 2X75IN STRL (MISCELLANEOUS) IMPLANT
GAUZE XEROFORM 1X8 LF (GAUZE/BANDAGES/DRESSINGS) ×2 IMPLANT
GLOVE BIO SURGEON STRL SZ7.5 (GLOVE) ×2 IMPLANT
GLOVE BIOGEL PI IND STRL 8 (GLOVE) ×2 IMPLANT
GLOVE BIOGEL PI IND STRL 8.5 (GLOVE) IMPLANT
GLOVE SURG ORTHO 8.0 STRL STRW (GLOVE) IMPLANT
GOWN STRL REUS W/ TWL LRG LVL3 (GOWN DISPOSABLE) ×2 IMPLANT
GOWN STRL REUS W/ TWL XL LVL3 (GOWN DISPOSABLE) ×2 IMPLANT
GOWN STRL REUS W/TWL XL LVL3 (GOWN DISPOSABLE) ×2 IMPLANT
LOOP VASCLR MAXI BLUE 18IN ST (MISCELLANEOUS) IMPLANT
LOOPS VASCLR MAXI BLUE 18IN ST (MISCELLANEOUS) IMPLANT
NDL HYPO 25X1 1.5 SAFETY (NEEDLE) IMPLANT
NEEDLE HYPO 25X1 1.5 SAFETY (NEEDLE) IMPLANT
NS IRRIG 1000ML POUR BTL (IV SOLUTION) ×2 IMPLANT
PACK BASIN DAY SURGERY FS (CUSTOM PROCEDURE TRAY) ×2 IMPLANT
PAD CAST 3X4 CTTN HI CHSV (CAST SUPPLIES) IMPLANT
PAD CAST 4YDX4 CTTN HI CHSV (CAST SUPPLIES) IMPLANT
PADDING CAST ABS COTTON 4X4 ST (CAST SUPPLIES) ×2 IMPLANT
SLEEVE SCD COMPRESS KNEE MED (STOCKING) IMPLANT
SPLINT FINGER 3.25 911903 (SOFTGOODS) IMPLANT
SPLINT PLASTER CAST XFAST 3X15 (CAST SUPPLIES) IMPLANT
SPLINT PLASTER CAST XFAST 4X15 (CAST SUPPLIES) IMPLANT
STOCKINETTE 4X48 STRL (DRAPES) ×2 IMPLANT
SUT CHROMIC 5 0 P 3 (SUTURE) IMPLANT
SUT CHROMIC 6 0 G 1 (SUTURE) IMPLANT
SUT CHROMIC 6 0 PS 4 (SUTURE) IMPLANT
SUT ETHILON 3 0 PS 1 (SUTURE) IMPLANT
SUT ETHILON 4 0 PS 2 18 (SUTURE) ×2 IMPLANT
SUT MERSILENE 4 0 P 3 (SUTURE) IMPLANT
SUT MON AB 5-0 P3 18 (SUTURE) IMPLANT
SUT VIC AB 3-0 PS1 18XBRD (SUTURE) IMPLANT
SUT VIC AB 4-0 PS2 18 (SUTURE) IMPLANT
SUT VIC AB 5-0 P-3 18X BRD (SUTURE) IMPLANT
SWAB COLLECTION DEVICE MRSA (MISCELLANEOUS) IMPLANT
SWAB CULTURE ESWAB REG 1ML (MISCELLANEOUS) IMPLANT
SYR BULB EAR ULCER 3OZ GRN STR (SYRINGE) ×2 IMPLANT
SYR CONTROL 10ML LL (SYRINGE) IMPLANT
SYR TOOMEY 50ML (SYRINGE) IMPLANT
TOWEL GREEN STERILE FF (TOWEL DISPOSABLE) ×4 IMPLANT
TRAY DSU PREP LF (CUSTOM PROCEDURE TRAY) ×2 IMPLANT
TUBE NG 5FR 35IN ENFIT (TUBING) IMPLANT
UNDERPAD 30X36 HEAVY ABSORB (UNDERPADS AND DIAPERS) ×2 IMPLANT

## 2024-01-11 NOTE — H&P (Signed)
 Deanna Reyes is an 23 y.o. female.   Chief Complaint: crush injury HPI: 23 yo female with crush injury left long finger.  Nail avulsed from fold and displaced fracture of distal phalanx.  She wishes to proceed with removal of nail plate, irrigation and debridement and reduction of fracture and nail bed repair as necessary.  Allergies: No Known Allergies  Past Medical History:  Diagnosis Date   Anemia    Medical history non-contributory     Past Surgical History:  Procedure Laterality Date   NO PAST SURGERIES      Family History: Family History  Problem Relation Age of Onset   Diabetes type I Mother    Lung cancer Father        smoker     Social History:   reports that she has been smoking cigarettes. She uses smokeless tobacco. She reports that she does not drink alcohol and does not use drugs.  Medications: Medications Prior to Admission  Medication Sig Dispense Refill   cephALEXin  (KEFLEX ) 500 MG capsule Take 1 capsule (500 mg total) by mouth 4 (four) times daily. 20 capsule 0   ibuprofen  (ADVIL ) 800 MG tablet Take 1 tablet (800 mg total) by mouth every 8 (eight) hours as needed. 30 tablet 0   Prenatal Vit-Fe Phos-FA-Omega (VITAFOL  GUMMIES) 3.33-0.333-34.8 MG CHEW Chew 3 tablets by mouth daily. (Patient not taking: Reported on 04/09/2020) 90 tablet 11    Results for orders placed or performed during the hospital encounter of 01/11/24 (from the past 48 hours)  Pregnancy, urine POC     Status: None   Collection Time: 01/11/24 11:36 AM  Result Value Ref Range   Preg Test, Ur NEGATIVE NEGATIVE    Comment:        THE SENSITIVITY OF THIS METHODOLOGY IS >24 mIU/mL     No results found.    Height 5\' 4"  (1.626 m), weight 60.8 kg.  General appearance: alert, cooperative, and appears stated age Head: Normocephalic, without obvious abnormality, atraumatic Neck: supple, symmetrical, trachea midline Extremities: Intact sensation and capillary refill all digits.   +epl/fpl/io.  Nail avulsed from fold. Skin: Skin color, texture, turgor normal. No rashes or lesions Neurologic: Grossly normal Incision/Wound: none  Assessment/Plan Left long finger crush injury.  Plan removal of nail plate, irrigation and debridement and reduction of fracture and nail bed repair as necessary.  Non operative and operative treatment options have been discussed with the patient and patient wishes to proceed with operative treatment. Risks, benefits, and alternatives of surgery have been discussed and the patient agrees with the plan of care.   Eliza Green 01/11/2024, 11:41 AM

## 2024-01-11 NOTE — Transfer of Care (Signed)
 Immediate Anesthesia Transfer of Care Note  Patient: Deanna Reyes  Procedure(s) Performed: IRRIGATION AND DEBRIDEMENT, OPEN FRACTURE (Left: Middle Finger) OPEN REDUCTION INTERNAL FIXATION (ORIF) DISTAL PHALANX (Left: Middle Finger) REPAIR, NAIL BED (Left: Middle Finger)  Patient Location: PACU  Anesthesia Type:General  Level of Consciousness: drowsy  Airway & Oxygen Therapy: Patient Spontanous Breathing and Patient connected to face mask oxygen  Post-op Assessment: Report given to RN and Post -op Vital signs reviewed and stable  Post vital signs: Reviewed and stable  Last Vitals:  Vitals Value Taken Time  BP 106/61 01/11/24 1626  Temp 36.3 C 01/11/24 1626  Pulse 70 01/11/24 1627  Resp 21 01/11/24 1627  SpO2 100 % 01/11/24 1627  Vitals shown include unfiled device data.  Last Pain:  Vitals:   01/11/24 1151  TempSrc: Tympanic  PainSc: 0-No pain      Patients Stated Pain Goal: 5 (01/11/24 1151)  Complications: No notable events documented.

## 2024-01-11 NOTE — Op Note (Signed)
 NAME: ARAYAH KROUSE MEDICAL RECORD NO: [id number] DATE OF BIRTH: 12/14/2000 FACILITY: [facility] LOCATION: [facility] PHYSICIAN: Lysbeth Dicola R. [place] Doig, MD   OPERATIVE REPORT   DATE OF PROCEDURE: 01/11/24    PREOPERATIVE DIAGNOSIS: Left long finger crush injury with distal phalanx fracture and nailbed injury and laceration   POSTOPERATIVE DIAGNOSIS: Left long finger crush injury with open distal phalanx fracture and nailbed injury and laceration   PROCEDURE: 1.  Left long finger irrigation and debridement open distal phalanx fracture 2.  Left long finger reduction of distal phalanx fracture 3.  Left long finger repair of nailbed laceration 4.  Left long finger debridement and suturing of palmar wound   SURGEON:  Brunilda Capra, M.D.   ASSISTANT: none   ANESTHESIA:  General   INTRAVENOUS FLUIDS:  Per anesthesia flow sheet.   ESTIMATED BLOOD LOSS:  Minimal.   COMPLICATIONS:  None.   SPECIMENS:  none   TOURNIQUET TIME:    Total Tourniquet Time Documented: Upper Arm (Left) - 19 minutes Total: Upper Arm (Left) - 19 minutes    DISPOSITION:  Stable to PACU.   INDICATIONS: 23 year old female states that a week ago she sustained a crushing injury to the left long finger.  Her wound was cleaned and dressed.  She followed up in the office.  She had avulsion of the nail plate from the nail fold.  I recommended irrigation and debridement of the wound with removal of nail plate and reduction of open distal phalanx fracture and repair of skin and nailbed lacerations in the OR.  Risks, benefits and alternatives of surgery were discussed including the risks of blood loss, infection, damage to nerves, vessels, tendons, ligaments, bone for surgery, need for additional surgery, complications with wound healing, continued pain, stiffness, nail deformity.  She voiced understanding of these risks and elected to proceed.  OPERATIVE COURSE:  After being identified preoperatively by  myself,  the patient and I agreed on the procedure and site of the procedure.  The surgical site was marked.  Surgical consent had been signed. Preoperative IV antibiotic prophylaxis was given. She was transferred to the operating room and placed on the operating table in supine position with the left upper extremity on an arm board.  General anesthesia was induced by the anesthesiologist.  Left upper extremity was prepped and draped in normal sterile orthopedic fashion.  A surgical pause was performed between the surgeons, anesthesia, and operating room staff and all were in agreement as to the patient, procedure, and site of procedure.  Tourniquet at the proximal aspect of the extremity was inflated to 250 mmHg after exsanguination of the arm with an Esmarch bandage.  The sutures were removed from the palmar wound.  The wound edges were debrided of devitalized tissue.  The wound was examined.  The flexor tendon was visualized and was intact.  No gross contamination.  The nail plate was then removed with a Therapist, nutritional.  There was laceration of the proximal aspect of the nailbed.  This created a communication with the distal phalanx tuft fracture.  The area was debrided of any devitalized tissue and hematoma with the pickups and Ray-Tec sponge.  The volar and dorsal wounds were copiously irrigated with sterile saline.  The palmar laceration was repaired with 5-0 Monocryl suture.  The distal phalanx tuft fracture was reduced.  6-0 chromic suture was used to reapproximate the proximal nailbed back into the nail fold in a horizontal mattress technique.  A piece of Xeroform  was placed in the nail fold and the wounds dressed with sterile Xeroform and 4 x 4 and wrapped with a Coban dressing lightly.  AlumaFoam splint was placed and wrapped lightly with Coban dressing.  The tourniquet was deflated at 19 minutes.  Fingertips were pink with brisk capillary refill after deflation of tourniquet.  The operative  drapes were  broken down.  The patient was awoken from anesthesia safely.  She was transferred back to the stretcher and taken to PACU in stable condition.  I will see her back in the office in 1 week for postoperative followup.  I will give her a prescription for Norco 5/325 1 tab PO q6 hours prn pain, dispense #15 Bactrim DS 1 p.o. twice daily x 7 days.   Jihan Mellette, MD Electronically signed, 01/11/24

## 2024-01-11 NOTE — Anesthesia Postprocedure Evaluation (Signed)
 Anesthesia Post Note  Patient: Deanna Reyes  Procedure(s) Performed: IRRIGATION AND DEBRIDEMENT, OPEN FRACTURE (Left: Middle Finger) OPEN REDUCTION INTERNAL FIXATION (ORIF) DISTAL PHALANX (Left: Middle Finger) REPAIR, NAIL BED (Left: Middle Finger)     Patient location during evaluation: PACU Anesthesia Type: General Level of consciousness: awake and alert Pain management: pain level controlled Vital Signs Assessment: post-procedure vital signs reviewed and stable Respiratory status: spontaneous breathing, nonlabored ventilation, respiratory function stable and patient connected to nasal cannula oxygen Cardiovascular status: blood pressure returned to baseline and stable Postop Assessment: no apparent nausea or vomiting Anesthetic complications: no  No notable events documented.  Last Vitals:  Vitals:   01/11/24 1645 01/11/24 1719  BP: 110/68 100/63  Pulse: 69 75  Resp: 14 16  Temp:  (!) 36.2 C  SpO2: 100% 99%    Last Pain:  Vitals:   01/11/24 1710  TempSrc:   PainSc: 0-No pain                 [name] L [name]

## 2024-01-11 NOTE — Anesthesia Procedure Notes (Signed)
 Procedure Name: LMA Insertion Date/Time: 01/11/2024 3:41 PM  Performed by: Noralyn Beams, CRNAPre-anesthesia Checklist: Patient identified, Emergency Drugs available, Suction available and Patient being monitored Patient Re-evaluated:Patient Re-evaluated prior to induction Oxygen Delivery Method: Circle system utilized Preoxygenation: Pre-oxygenation with 100% oxygen Induction Type: IV induction Ventilation: Mask ventilation without difficulty LMA: LMA inserted LMA Size: 4.0 Number of attempts: 1 Airway Equipment and Method: Bite block Placement Confirmation: positive ETCO2 Tube secured with: Tape Dental Injury: Teeth and Oropharynx as per pre-operative assessment

## 2024-01-11 NOTE — Discharge Instructions (Addendum)
 [facility] Center Instructions Hand Surgery  Wound Care: Keep your hand elevated above the level of your heart.  Do not allow it to dangle by your side.  Keep the dressing dry and do not remove it unless your doctor advises you to do so.  He will usually change it at the time of your post-op visit.  Moving your fingers is advised to stimulate circulation but will depend on the site of your surgery.  If you have a splint applied, your doctor will advise you regarding movement.  Activity: Do not drive or operate machinery today.  Rest today and then you may return to your normal activity and work as indicated by your physician.  Diet:  Drink liquids today or eat a light diet.  You may resume a regular diet tomorrow.    General expectations: Pain for two to three days. Fingers may become slightly swollen.  Call your doctor if any of the following occur: Severe pain not relieved by pain medication. Elevated temperature. Dressing soaked with blood. Inability to move fingers. White or bluish color to fingers.     Post Anesthesia Home Care Instructions  Activity: Get plenty of rest for the remainder of the day. A responsible individual must stay with you for 24 hours following the procedure.  For the next 24 hours, DO NOT: -Drive a car -Advertising copywriter -Drink alcoholic beverages -Take any medication unless instructed by your physician -Make any legal decisions or sign important papers.  Meals: Start with liquid foods such as gelatin or soup. Progress to regular foods as tolerated. Avoid greasy, spicy, heavy foods. If nausea and/or vomiting occur, drink only clear liquids until the nausea and/or vomiting subsides. Call your physician if vomiting continues.  Special Instructions/Symptoms: Your throat may feel dry or sore from the anesthesia or the breathing tube placed in your throat during surgery. If this causes discomfort, gargle with warm salt water . The discomfort should disappear  within 24 hours.  If you had a scopolamine patch placed behind your ear for the management of post- operative nausea and/or vomiting:  1. The medication in the patch is effective for 72 hours, after which it should be removed.  Wrap patch in a tissue and discard in the trash. Wash hands thoroughly with soap and water . 2. You may remove the patch earlier than 72 hours if you experience unpleasant side effects which may include dry mouth, dizziness or visual disturbances. 3. Avoid touching the patch. Wash your hands with soap and water  after contact with the patch.     No tylenol  until after 6pm

## 2024-01-11 NOTE — Anesthesia Preprocedure Evaluation (Addendum)
 Anesthesia Evaluation  Patient identified by MRN, date of birth, ID band Patient awake    Reviewed: Allergy & Precautions, NPO status , Patient's Chart, lab work & pertinent test results  Airway Mallampati: I  TM Distance: >3 FB Neck ROM: Full    Dental no notable dental hx. (+) Teeth Intact, Dental Advisory Given   Pulmonary Current Smoker and Patient abstained from smoking.   Pulmonary exam normal breath sounds clear to auscultation       Cardiovascular negative cardio ROS Normal cardiovascular exam Rhythm:Regular Rate:Normal     Neuro/Psych negative neurological ROS  negative psych ROS   GI/Hepatic negative GI ROS, Neg liver ROS,,,  Endo/Other  negative endocrine ROS    Renal/GU negative Renal ROS  negative genitourinary   Musculoskeletal negative musculoskeletal ROS (+)    Abdominal   Peds  Hematology negative hematology ROS (+)   Anesthesia Other Findings   Reproductive/Obstetrics                             Anesthesia Physical Anesthesia Plan  ASA: 2  Anesthesia Plan: General   Post-op Pain Management: Tylenol  PO (pre-op)*   Induction: Intravenous  PONV Risk Score and Plan: 2 and Ondansetron , Dexamethasone and Midazolam  Airway Management Planned: LMA  Additional Equipment:   Intra-op Plan:   Post-operative Plan: Extubation in OR  Informed Consent: I have reviewed the patients History and Physical, chart, labs and discussed the procedure including the risks, benefits and alternatives for the proposed anesthesia with the patient or authorized representative who has indicated his/her understanding and acceptance.     Dental advisory given  Plan Discussed with: CRNA  Anesthesia Plan Comments:        Anesthesia Quick Evaluation

## 2024-01-12 ENCOUNTER — Encounter (HOSPITAL_BASED_OUTPATIENT_CLINIC_OR_DEPARTMENT_OTHER): Payer: Self-pay | Admitting: Orthopedic Surgery

## 2024-06-21 ENCOUNTER — Encounter (HOSPITAL_COMMUNITY): Payer: Self-pay

## 2024-06-21 ENCOUNTER — Ambulatory Visit (HOSPITAL_COMMUNITY)
Admission: EM | Admit: 2024-06-21 | Discharge: 2024-06-21 | Disposition: A | Discharge status: Home / Self Care | Payer: Self-pay | Attending: Physician Assistant | Admitting: Physician Assistant

## 2024-06-21 DIAGNOSIS — Z113 Encounter for screening for infections with a predominantly sexual mode of transmission: Secondary | ICD-10-CM | POA: Insufficient documentation

## 2024-06-21 DIAGNOSIS — N898 Other specified noninflammatory disorders of vagina: Secondary | ICD-10-CM | POA: Insufficient documentation

## 2024-06-21 MED ORDER — FLUCONAZOLE 150 MG PO TABS: 150.0000 mg | ORAL_TABLET | ORAL | 0 refills | Status: AC | PRN

## 2024-06-21 MED ORDER — METRONIDAZOLE 500 MG PO TABS: 500.0000 mg | ORAL_TABLET | Freq: Two times a day (BID) | ORAL | 0 refills | Status: AC

## 2024-06-21 NOTE — ED Provider Notes (Signed)
 [facility]    CSN: [id number] Arrival date & time: 06/21/24  1333      History   Chief Complaint Chief Complaint  Patient presents with   Vaginal Discharge    HPI Deanna Reyes is a 23 y.o. female.   Patient presents today with a 3-day history of vaginal discharge and odor.  She describes the discharge as thick and white like cottage cheese but also with an associated odor.  She has a history of recurrent BV and has been seen by the health department where she was treated for recurrent BV with boric acid suppositories and monthly 2000 mg of metronidazole  and 1 dose of Diflucan .  She reports that despite the use of treatment she continues to have recurrent symptoms averaging 1 time per month.  She is unsure if this could be related to her IUD as she does not have symptoms like this before having the IUD placed.  She denies any pelvic pain, abdominal pain, fever, nausea, vomiting, genital lesion.  She has no specific in centering for STI but is open to testing.  Denies any recent antibiotics or medication changes.  Denies history of diabetes and does not take an SGLT2 inhibitor.  She has not tried any over-the-counter medication for symptom management.  She is confident that she is not pregnant.    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/02/2019   Hx of chlamydia infection 04/02/2019    Past Surgical History:  Procedure Laterality Date   IRRIGATION AND DEBRIDEMENT POSTERIOR HIP Left 01/11/2024   Procedure: IRRIGATION AND DEBRIDEMENT, OPEN FRACTURE;  Surgeon: Murrell Drivers, MD;  Location: [facility];  Service: Orthopedics;  Laterality: Left;  LEFT LONG FINGER IRRIGATION AND DEBRIDEMENT AND REDUCTION FRACTURE, REPAIR SKIN AND NAIL BED   NAILBED REPAIR Left 01/11/2024   Procedure: REPAIR, NAIL BED;  Surgeon:  Murrell Drivers, MD;  Location: [facility];  Service: Orthopedics;  Laterality: Left;   NO PAST SURGERIES     OPEN REDUCTION INTERNAL FIXATION (ORIF) DISTAL PHALANX Left 01/11/2024   Procedure: OPEN REDUCTION INTERNAL FIXATION (ORIF) DISTAL PHALANX;  Surgeon: Murrell Drivers, MD;  Location: [facility];  Service: Orthopedics;  Laterality: Left;    OB History     Gravida  1   Para  1   Term  1   Preterm      AB      Living  1      SAB      IAB      Ectopic      Multiple  0   Live Births  1            Home Medications    Prior to Admission medications   Medication Sig Start Date End Date Taking? Authorizing Provider  fluconazole  (DIFLUCAN ) 150 MG tablet Take 1 tablet (150 mg total) by mouth every 3 (three) days as needed for up to 2 doses. 06/21/24  Yes Thelma Lorenzetti K, PA-C  metroNIDAZOLE  (FLAGYL ) 500 MG tablet Take 1 tablet (500 mg total) by mouth 2 (two) times daily. 06/21/24  Yes Shanta Dorvil, Rocky POUR, PA-C    Family History Family History  Problem Relation Age of Onset   Diabetes type I Mother    Lung cancer Father  smoker     Social History Social History   Tobacco Use   Smoking status: Never   Smokeless tobacco: Current  Vaping Use   Vaping status: Every Day  Substance Use Topics   Alcohol use: No   Drug use: Yes    Types: Marijuana     Allergies   Patient has no known allergies.   Review of Systems Review of Systems  Constitutional:  Negative for activity change, appetite change, fatigue and fever.  Gastrointestinal:  Negative for abdominal pain, diarrhea, nausea and vomiting.  Genitourinary:  Positive for vaginal discharge. Negative for dysuria, frequency, pelvic pain, urgency, vaginal bleeding and vaginal pain.     Physical Exam Triage Vital Signs ED Triage Vitals [06/21/24 1527]  Encounter Vitals Group     BP 103/66     Girls Systolic BP Percentile      Girls Diastolic BP Percentile      Boys Systolic  BP Percentile      Boys Diastolic BP Percentile      Pulse Rate 61     Resp 16     Temp 98.4 F (36.9 C)     Temp Source Oral     SpO2 98 %     Weight      Height      Head Circumference      Peak Flow      Pain Score 0     Pain Loc      Pain Education      Exclude from Growth Chart    No data found.  Updated Vital Signs BP 103/66 (BP Location: Right Arm)   Pulse 61   Temp 98.4 F (36.9 C) (Oral)   Resp 16   LMP  (LMP Unknown)   SpO2 98%   Visual Acuity Right Eye Distance:   Left Eye Distance:   Bilateral Distance:    Right Eye Near:   Left Eye Near:    Bilateral Near:     Physical Exam Vitals reviewed.  Constitutional:      General: She is awake. She is not in acute distress.    Appearance: Normal appearance. She is well-developed. She is not ill-appearing.     Comments: Very pleasant female appears stated age in no acute distress sitting comfortably in exam room  HENT:     Head: Normocephalic and atraumatic.  Cardiovascular:     Rate and Rhythm: Normal rate and regular rhythm.     Heart sounds: Normal heart sounds, S1 normal and S2 normal. No murmur heard. Pulmonary:     Effort: Pulmonary effort is normal.     Breath sounds: Normal breath sounds. No wheezing, rhonchi or rales.     Comments: Clear to auscultation bilaterally Abdominal:     General: Bowel sounds are normal.     Palpations: Abdomen is soft.     Tenderness: There is no abdominal tenderness. There is no right CVA tenderness, left CVA tenderness, guarding or rebound.  Psychiatric:        Behavior: Behavior is cooperative.      UC Treatments / Results  Labs (all labs ordered are listed, but only abnormal results are displayed) Labs Reviewed  CERVICOVAGINAL ANCILLARY ONLY    EKG   Radiology No results found.  Procedures Procedures (including critical care time)  Medications Ordered in UC Medications - No data to display  Initial Impression / Assessment and Plan / UC Course   I have reviewed the triage vital signs and  the nursing notes.  Pertinent labs & imaging results that were available during my care of the patient were reviewed by me and considered in my medical decision making (see chart for details).     Patient is well-appearing, afebrile, nontoxic, nontachycardic.  Vital signs and physical exam are reassuring with no indication for emergent evaluation or imaging.  STI swab was collected and is pending.  Suspect BV with possible concurrent yeast infection given her clinical presentation.  Will start metronidazole  twice daily for 7 days and we discussed that she should not drink any alcohol while on this medication for 3 days after she completes the course.  She was also given 2 dose of Diflucan  with instruction to take the first once a day and a second 1 in 3 days if her symptoms persist.  She is to wear loosefitting cotton underwear use hypoallergenic soaps and detergents.  Recommended close follow-up with her primary care.  We did discuss that there is now some evidence to treat female partners with recurrent BV and so if this is something her partner is open to he can follow-up with his PCP or our clinic.  If anything worsens or changes she is to be seen immediately.  Strict return precautions given.   Final Clinical Impressions(s) / UC Diagnoses   Final diagnoses:  Vaginal discharge  Vaginal odor  Screening examination for STI     Discharge Instructions      Start metronidazole  twice daily for 7 days to cover for bacterial vaginosis.  Do not drink any alcohol while on this medication as it can cause you to vomit.  You should avoid alcohol for a total of 10 days (3 days after you finish the medication).  I have also called in 2 doses of Diflucan .  Take 1 dose today and a second dose in 3 days.  Wear loosefitting cotton underwear and use hypoallergenic soaps and detergents.  Will contact you if we need to arrange any additional treatment.  As we discussed,  it may be worthwhile to have your partner treated for BV if this continues to be a recurrent problem.  He would need to be evaluated.  If anything worsens and you have pelvic pain, abdominal pain, fever, nausea, vomiting you need to be seen immediately.     ED Prescriptions     Medication Sig Dispense Auth. Provider   metroNIDAZOLE  (FLAGYL ) 500 MG tablet Take 1 tablet (500 mg total) by mouth 2 (two) times daily. 14 tablet Aaden Buckman K, PA-C   fluconazole  (DIFLUCAN ) 150 MG tablet Take 1 tablet (150 mg total) by mouth every 3 (three) days as needed for up to 2 doses. 2 tablet Khiree Bukhari K, PA-C      PDMP not reviewed this encounter.   Sherrell Rocky POUR, PA-C 06/21/24 1605

## 2024-06-21 NOTE — Discharge Instructions (Signed)
 Start metronidazole  twice daily for 7 days to cover for bacterial vaginosis.  Do not drink any alcohol while on this medication as it can cause you to vomit.  You should avoid alcohol for a total of 10 days (3 days after you finish the medication).  I have also called in 2 doses of Diflucan .  Take 1 dose today and a second dose in 3 days.  Wear loosefitting cotton underwear and use hypoallergenic soaps and detergents.  Will contact you if we need to arrange any additional treatment.  As we discussed, it may be worthwhile to have your partner treated for BV if this continues to be a recurrent problem.  He would need to be evaluated.  If anything worsens and you have pelvic pain, abdominal pain, fever, nausea, vomiting you need to be seen immediately.

## 2024-06-21 NOTE — ED Triage Notes (Signed)
Patient here today with c/o vaginal discharge and odor X 3 days. Patient has a h/o BV.

## 2024-06-25 LAB — CERVICOVAGINAL ANCILLARY ONLY
Bacterial Vaginitis (gardnerella): POSITIVE — AB
Candida Glabrata: NEGATIVE
Candida Vaginitis: NEGATIVE
Chlamydia: NEGATIVE
Comment: NEGATIVE
Comment: NEGATIVE
Comment: NEGATIVE
Comment: NEGATIVE
Comment: NEGATIVE
Comment: NORMAL
Neisseria Gonorrhea: NEGATIVE
Trichomonas: NEGATIVE

## 2024-06-26 ENCOUNTER — Ambulatory Visit (HOSPITAL_COMMUNITY): Payer: Self-pay
